# Patient Record
Sex: Female | Born: 1975 | Race: White | Hispanic: No | Marital: Married | Smoking: Never smoker
Health system: Southern US, Community
[De-identification: ages and names within clinical notes are randomized; demographics above are authoritative.]

## PROBLEM LIST (undated history)

## (undated) DIAGNOSIS — G47419 Narcolepsy without cataplexy: Secondary | ICD-10-CM

## (undated) DIAGNOSIS — Z9889 Other specified postprocedural states: Secondary | ICD-10-CM

## (undated) DIAGNOSIS — F32A Depression, unspecified: Secondary | ICD-10-CM

## (undated) DIAGNOSIS — R519 Headache, unspecified: Secondary | ICD-10-CM

## (undated) DIAGNOSIS — F419 Anxiety disorder, unspecified: Secondary | ICD-10-CM

## (undated) DIAGNOSIS — Z973 Presence of spectacles and contact lenses: Secondary | ICD-10-CM

## (undated) DIAGNOSIS — J45909 Unspecified asthma, uncomplicated: Secondary | ICD-10-CM

## (undated) DIAGNOSIS — O24419 Gestational diabetes mellitus in pregnancy, unspecified control: Secondary | ICD-10-CM

---

## 1998-01-23 HISTORY — PX: WISDOM TOOTH EXTRACTION: SHX21

## 2005-01-23 HISTORY — PX: CHOLECYSTECTOMY: SHX55

## 2010-01-23 DIAGNOSIS — M797 Fibromyalgia: Secondary | ICD-10-CM

## 2010-01-23 HISTORY — PX: ESOPHAGOGASTRODUODENOSCOPY: SHX1529

## 2010-01-23 HISTORY — DX: Fibromyalgia: M79.7

## 2011-01-24 HISTORY — PX: ESSURE TUBAL LIGATION: SUR464

## 2016-02-22 DIAGNOSIS — Z01419 Encounter for gynecological examination (general) (routine) without abnormal findings: Secondary | ICD-10-CM | POA: Diagnosis not present

## 2016-03-28 DIAGNOSIS — N611 Abscess of the breast and nipple: Secondary | ICD-10-CM | POA: Diagnosis not present

## 2016-05-19 DIAGNOSIS — G47419 Narcolepsy without cataplexy: Secondary | ICD-10-CM | POA: Diagnosis not present

## 2016-10-27 DIAGNOSIS — Z0000 Encounter for general adult medical examination without abnormal findings: Secondary | ICD-10-CM | POA: Diagnosis not present

## 2016-10-27 DIAGNOSIS — Z79899 Other long term (current) drug therapy: Secondary | ICD-10-CM | POA: Diagnosis not present

## 2016-10-27 DIAGNOSIS — M797 Fibromyalgia: Secondary | ICD-10-CM | POA: Diagnosis not present

## 2016-11-20 DIAGNOSIS — L309 Dermatitis, unspecified: Secondary | ICD-10-CM | POA: Diagnosis not present

## 2016-11-20 DIAGNOSIS — L259 Unspecified contact dermatitis, unspecified cause: Secondary | ICD-10-CM | POA: Diagnosis not present

## 2016-11-20 DIAGNOSIS — J301 Allergic rhinitis due to pollen: Secondary | ICD-10-CM | POA: Diagnosis not present

## 2016-11-20 DIAGNOSIS — J3089 Other allergic rhinitis: Secondary | ICD-10-CM | POA: Diagnosis not present

## 2016-11-22 DIAGNOSIS — J301 Allergic rhinitis due to pollen: Secondary | ICD-10-CM | POA: Diagnosis not present

## 2016-11-22 DIAGNOSIS — J3089 Other allergic rhinitis: Secondary | ICD-10-CM | POA: Diagnosis not present

## 2016-11-22 DIAGNOSIS — L230 Allergic contact dermatitis due to metals: Secondary | ICD-10-CM | POA: Diagnosis not present

## 2017-04-25 DIAGNOSIS — R05 Cough: Secondary | ICD-10-CM | POA: Diagnosis not present

## 2017-04-25 DIAGNOSIS — L230 Allergic contact dermatitis due to metals: Secondary | ICD-10-CM | POA: Diagnosis not present

## 2017-04-25 DIAGNOSIS — J301 Allergic rhinitis due to pollen: Secondary | ICD-10-CM | POA: Diagnosis not present

## 2017-04-25 DIAGNOSIS — J3089 Other allergic rhinitis: Secondary | ICD-10-CM | POA: Diagnosis not present

## 2017-05-04 DIAGNOSIS — Z01419 Encounter for gynecological examination (general) (routine) without abnormal findings: Secondary | ICD-10-CM | POA: Diagnosis not present

## 2017-07-24 DIAGNOSIS — J301 Allergic rhinitis due to pollen: Secondary | ICD-10-CM | POA: Diagnosis not present

## 2017-07-24 DIAGNOSIS — L230 Allergic contact dermatitis due to metals: Secondary | ICD-10-CM | POA: Diagnosis not present

## 2017-07-24 DIAGNOSIS — J3089 Other allergic rhinitis: Secondary | ICD-10-CM | POA: Diagnosis not present

## 2017-07-24 DIAGNOSIS — R05 Cough: Secondary | ICD-10-CM | POA: Diagnosis not present

## 2017-12-10 DIAGNOSIS — Z0000 Encounter for general adult medical examination without abnormal findings: Secondary | ICD-10-CM | POA: Diagnosis not present

## 2017-12-10 DIAGNOSIS — Z23 Encounter for immunization: Secondary | ICD-10-CM | POA: Diagnosis not present

## 2017-12-10 DIAGNOSIS — E7800 Pure hypercholesterolemia, unspecified: Secondary | ICD-10-CM | POA: Diagnosis not present

## 2017-12-10 DIAGNOSIS — N920 Excessive and frequent menstruation with regular cycle: Secondary | ICD-10-CM | POA: Diagnosis not present

## 2017-12-10 DIAGNOSIS — E559 Vitamin D deficiency, unspecified: Secondary | ICD-10-CM | POA: Diagnosis not present

## 2018-03-25 ENCOUNTER — Other Ambulatory Visit: Payer: Self-pay | Admitting: Family Medicine

## 2018-03-25 ENCOUNTER — Ambulatory Visit
Admission: RE | Admit: 2018-03-25 | Discharge: 2018-03-25 | Disposition: A | Discharge status: Home / Self Care | Payer: 59 | Source: Ambulatory Visit | Attending: Family Medicine | Admitting: Family Medicine

## 2018-03-25 DIAGNOSIS — M5412 Radiculopathy, cervical region: Secondary | ICD-10-CM

## 2018-03-25 DIAGNOSIS — M542 Cervicalgia: Secondary | ICD-10-CM | POA: Diagnosis not present

## 2018-04-09 ENCOUNTER — Ambulatory Visit: Payer: 59 | Admitting: Physical Therapy

## 2018-08-28 ENCOUNTER — Other Ambulatory Visit: Payer: Self-pay

## 2018-08-28 DIAGNOSIS — Z20822 Contact with and (suspected) exposure to covid-19: Secondary | ICD-10-CM

## 2018-08-29 LAB — NOVEL CORONAVIRUS, NAA: SARS-CoV-2, NAA: NOT DETECTED

## 2019-01-24 DIAGNOSIS — R7303 Prediabetes: Secondary | ICD-10-CM

## 2019-01-24 HISTORY — DX: Prediabetes: R73.03

## 2019-10-21 ENCOUNTER — Other Ambulatory Visit: Payer: Self-pay | Admitting: Obstetrics and Gynecology

## 2019-10-21 DIAGNOSIS — Z1231 Encounter for screening mammogram for malignant neoplasm of breast: Secondary | ICD-10-CM

## 2019-11-11 ENCOUNTER — Ambulatory Visit: Payer: 59

## 2020-08-10 ENCOUNTER — Other Ambulatory Visit: Payer: Self-pay

## 2020-08-10 ENCOUNTER — Ambulatory Visit
Admission: RE | Admit: 2020-08-10 | Discharge: 2020-08-10 | Disposition: A | Discharge status: Home / Self Care | Payer: 59 | Source: Ambulatory Visit | Attending: Obstetrics and Gynecology | Admitting: Obstetrics and Gynecology

## 2020-08-10 DIAGNOSIS — Z1231 Encounter for screening mammogram for malignant neoplasm of breast: Secondary | ICD-10-CM

## 2021-01-23 HISTORY — PX: ENDOMETRIAL BIOPSY: SHX622

## 2021-01-31 ENCOUNTER — Other Ambulatory Visit: Payer: Self-pay

## 2021-01-31 ENCOUNTER — Encounter (HOSPITAL_BASED_OUTPATIENT_CLINIC_OR_DEPARTMENT_OTHER): Payer: Self-pay | Admitting: Obstetrics and Gynecology

## 2021-01-31 NOTE — Progress Notes (Signed)
PLEASE WEAR A MASK OUT IN PUBLIC AND SOCIAL DISTANCE AND WASH YOUR HANDS FREQUENTLY. PLEASE ASK ALL YOUR CLOSE HOUSEHOLD CONTACT TO WEAR MASK OUT IN PUBLIC AND SOCIAL DISTANCE AND WASH HANDS FREQUENTLY ALSO.      Your procedure is scheduled on Tuesday 02/08/21.  Report to [facility] AT 8:30 A. M.   Call this number if you have problems the morning of surgery  :[phone].   OUR ADDRESS IS [address].  [facility] LOCATED IN [facility].  PLEASE BRING YOUR INSURANCE CARD AND PHOTO ID DAY OF SURGERY.  ONLY ONE PERSON ALLOWED IN FACILITY WAITING AREA.                                     REMEMBER:  DO NOT EAT FOOD, CANDY GUM OR MINTS  AFTER MIDNIGHT THE NIGHT BEFORE YOUR SURGERY . YOU MAY HAVE CLEAR LIQUIDS FROM MIDNIGHT THE NIGHT BEFORE YOUR SURGERY UNTIL  7:30 AM. NO CLEAR LIQUIDS AFTER   7:30 AM DAY OF SURGERY.   YOU MAY  BRUSH YOUR TEETH MORNING OF SURGERY AND RINSE YOUR MOUTH OUT, NO CHEWING GUM CANDY OR MINTS.    CLEAR LIQUID DIET   Foods Allowed                                                                     Foods Excluded  Coffee and tea, regular and decaf                             liquids that you cannot  Plain Jell-O any favor except red or purple                                           see through such as: Fruit ices (not with fruit pulp)                                     milk, soups, orange juice  Iced Popsicles                                    All solid food Carbonated beverages, regular and diet                                    Cranberry, grape and apple juices Sports drinks like Gatorade  Sample Menu Breakfast                                Lunch  Supper Cranberry juice                                           Jell-O                                     Grape juice                           Apple juice Coffee or tea                        Jell-O                                       Popsicle                                                Coffee or tea                        Coffee or tea  _____________________________________________________________________     TAKE THESE MEDICATIONS MORNING OF SURGERY WITH A SIP OF WATER:  Albuterol inhaler if needed (please bring), Singulair, Cymbalta & Flonase  ONE VISITOR IS ALLOWED IN WAITING ROOM ONLY DAY OF SURGERY.  YOU MAY HAVE ANOTHER PERSON SWITCH OUT WITH THE  1  VISITOR IN THE WAITING ROOM DAY OF SURGERY AND A MASK MUST BE WORN IN THE WAITING ROOM.    2 VISITORS  MAY VISIT IN THE EXTENDED RECOVERY ROOM UNTIL 800 PM ONLY 1 VISITOR AGE 50 AND OVER MAY SPEND THE NIGHT AND MUST BE IN EXTENDED RECOVERY ROOM NO LATER THAN 800 PM .    UP TO 2 CHILDREN AGE 41 TO 15 MAY ALSO VISIT IN EXTENDED RECOV ERY ROOM ONLY UNTIL 800 PM AND MUST LEAVE BY 800 PM.   ALL PERSONS VISITING IN EXTENDED RECOVERY ROOM MUST WEAR A MASK.                                    DO NOT WEAR JEWERLY, MAKE UP. DO NOT WEAR LOTIONS, POWDERS, PERFUMES OR NAIL POLISH ON YOUR FINGERNAILS. TOENAIL POLISH IS OK TO WEAR. DO NOT SHAVE FOR 48 HOURS PRIOR TO DAY OF SURGERY. MEN MAY SHAVE FACE AND NECK. CONTACTS, GLASSES, OR DENTURES MAY NOT BE WORN TO SURGERY.                                    [facility] IS NOT RESPONSIBLE  FOR ANY BELONGINGS.                                                                    Marland Kitchen  [facility] - Preparing for Surgery Before surgery, you can play an important role.  Because skin is not sterile, your skin needs to be as free of germs as possible.  You can reduce the number of germs on your skin by washing with CHG (chlorahexidine gluconate) soap before surgery.  CHG is an antiseptic cleaner which kills germs and bonds with the skin to continue killing germs even after washing. Please DO NOT use if you have an allergy to CHG or antibacterial soaps.  If your skin becomes reddened/irritated stop using the CHG and inform your  nurse when you arrive at Short Stay. Do not shave (including legs and underarms) for at least 48 hours prior to the first CHG shower.  You may shave your face/neck. Please follow these instructions carefully:  1.  Shower with CHG Soap the night before surgery and the  morning of Surgery.  2.  If you choose to wash your hair, wash your hair first as usual with your  normal  shampoo.  3.  After you shampoo, rinse your hair and body thoroughly to remove the  shampoo.                            4.  Use CHG as you would any other liquid soap.  You can apply chg directly  to the skin and wash                      Gently with a scrungie or clean washcloth.  5.  Apply the CHG Soap to your body ONLY FROM THE NECK DOWN.   Do not use on face/ open                           Wound or open sores. Avoid contact with eyes, ears mouth and genitals (private parts).                       Wash face,  Genitals (private parts) with your normal soap.             6.  Wash thoroughly, paying special attention to the area where your surgery  will be performed.  7.  Thoroughly rinse your body with warm water from the neck down.  8.  DO NOT shower/wash with your normal soap after using and rinsing off  the CHG Soap.                9.  Pat yourself dry with a clean towel.            10.  Wear clean pajamas.            11.  Place clean sheets on your bed the night of your first shower and do not  sleep with pets. Day of Surgery : Do not apply any lotions/deodorants the morning of surgery.  Please wear clean clothes to the hospital/surgery center.  IF YOU HAVE ANY SKIN IRRITATION OR PROBLEMS WITH THE SURGICAL SOAP, PLEASE GET A BAR OF GOLD DIAL SOAP AND SHOWER THE NIGHT BEFORE YOUR SURGERY AND THE MORNING OF YOUR SURGERY. PLEASE LET THE NURSE KNOW MORNING OF YOUR SURGERY IF YOU HAD ANY PROBLEMS WITH THE SURGICAL SOAP.  FAILURE TO FOLLOW THESE INSTRUCTIONS MAY RESULT IN THE CANCELLATION OF YOUR SURGERY PATIENT  SIGNATURE_________________________________  NURSE SIGNATURE____Kelly Odis Luster, RN_____________________________  ________________________________________________________________________  QUESTIONS CALL Malaiah Viramontes PRE OP NURSE PHONE [phone].

## 2021-01-31 NOTE — Progress Notes (Addendum)
Spoke w/ via phone for pre-op interview---pt Lab needs dos----urine pregnancy POCT , requested orders from [facility] via Epic IB on 01/31/21             Lab results------Pt has a lab appt on 02/04/21 for CBC and type & screen. COVID test -----patient states asymptomatic no test needed Arrive at -------0830 on 02/08/21 NPO after MN NO Solid Food.  Clear liquids from MN until---0730 Med rec completed Medications to take morning of surgery -----Albuterol prn, Flonase, Singulair & Cymbalta Diabetic medication -----n/a Patient instructed no nail polish to be worn day of surgery Patient instructed to bring photo id and insurance card day of surgery Patient aware to have Driver (ride ) / caregiver    for 24 hours after surgery - husband [facility] Patient Special Instructions -----Bring inhaler. Extended stay instructions given. Pre-Op special Istructions -----none Patient verbalized understanding of instructions that were given at this phone interview. Patient denies shortness of breath, chest pain, fever, cough at this phone interview.

## 2021-02-04 ENCOUNTER — Encounter (HOSPITAL_COMMUNITY)
Admission: RE | Admit: 2021-02-04 | Discharge: 2021-02-04 | Disposition: A | Discharge status: Home / Self Care | Payer: 59 | Source: Ambulatory Visit | Attending: Obstetrics and Gynecology | Admitting: Obstetrics and Gynecology

## 2021-02-04 ENCOUNTER — Other Ambulatory Visit: Payer: Self-pay

## 2021-02-04 DIAGNOSIS — Z01812 Encounter for preprocedural laboratory examination: Secondary | ICD-10-CM | POA: Insufficient documentation

## 2021-02-04 DIAGNOSIS — Z01818 Encounter for other preprocedural examination: Secondary | ICD-10-CM

## 2021-02-04 LAB — CBC
HCT: 41.1 % (ref 36.0–46.0)
Hemoglobin: 13.4 g/dL (ref 12.0–15.0)
MCH: 29.7 pg (ref 26.0–34.0)
MCHC: 32.6 g/dL (ref 30.0–36.0)
MCV: 91.1 fL (ref 80.0–100.0)
Platelets: 202 10*3/uL (ref 150–400)
RBC: 4.51 MIL/uL (ref 3.87–5.11)
RDW: 12.9 % (ref 11.5–15.5)
WBC: 7.5 10*3/uL (ref 4.0–10.5)
nRBC: 0 % (ref 0.0–0.2)

## 2021-02-08 DIAGNOSIS — Z01818 Encounter for other preprocedural examination: Secondary | ICD-10-CM

## 2021-02-08 LAB — TYPE AND SCREEN
ABO/RH(D): O POS
Antibody Screen: NEGATIVE

## 2021-03-30 ENCOUNTER — Ambulatory Visit (HOSPITAL_BASED_OUTPATIENT_CLINIC_OR_DEPARTMENT_OTHER): Admission: RE | Admit: 2021-03-30 | Payer: 59 | Source: Home / Self Care | Admitting: Obstetrics and Gynecology

## 2021-03-30 DIAGNOSIS — Z01818 Encounter for other preprocedural examination: Secondary | ICD-10-CM

## 2021-03-30 HISTORY — DX: Unspecified asthma, uncomplicated: J45.909

## 2021-03-30 HISTORY — DX: Depression, unspecified: F32.A

## 2021-03-30 HISTORY — DX: Gestational diabetes mellitus in pregnancy, unspecified control: O24.419

## 2021-03-30 HISTORY — DX: Presence of spectacles and contact lenses: Z97.3

## 2021-03-30 HISTORY — DX: Anxiety disorder, unspecified: F41.9

## 2021-03-30 HISTORY — DX: Narcolepsy without cataplexy: G47.419

## 2021-03-30 HISTORY — DX: Headache, unspecified: R51.9

## 2021-03-30 SURGERY — XI ROBOTIC ASSISTED LAPAROSCOPIC HYSTERECTOMY AND SALPINGECTOMY
Anesthesia: Choice

## 2021-06-01 DIAGNOSIS — Z01818 Encounter for other preprocedural examination: Secondary | ICD-10-CM

## 2021-06-08 ENCOUNTER — Ambulatory Visit (HOSPITAL_BASED_OUTPATIENT_CLINIC_OR_DEPARTMENT_OTHER): Admit: 2021-06-08 | Payer: 59 | Admitting: Obstetrics and Gynecology

## 2021-06-08 ENCOUNTER — Encounter (HOSPITAL_BASED_OUTPATIENT_CLINIC_OR_DEPARTMENT_OTHER): Payer: Self-pay

## 2021-06-08 SURGERY — DILATATION & CURETTAGE/HYSTEROSCOPY WITH HYDROTHERMAL ABLATION
Anesthesia: Choice

## 2022-02-27 ENCOUNTER — Encounter (HOSPITAL_BASED_OUTPATIENT_CLINIC_OR_DEPARTMENT_OTHER): Payer: Self-pay | Admitting: Obstetrics and Gynecology

## 2022-02-27 ENCOUNTER — Other Ambulatory Visit: Payer: Self-pay

## 2022-02-27 NOTE — Progress Notes (Signed)
Your procedure is scheduled on Wednesday, 03/08/2022.  Report to [facility] AT  6:30 AM.   Call this number if you have problems the morning of surgery  :[phone].   OUR ADDRESS IS [place].  [facility]ATED [facility].  PLEASE BRING YOUR INSURANCE CARD AND PHOTO ID DAY OF SURGERY.  ONLY 2 PEOPLE ARE ALLOWED IN  WAITING  ROOM / CURRENTLY NO ONE UNDER AGE 45                                     REMEMBER:  DO NOT EAT FOOD, CANDY GUM OR MINTS  AFTER MIDNIGHT THE NIGHT BEFORE YOUR SURGERY . YOU MAY HAVE CLEAR LIQUIDS FROM MIDNIGHT THE NIGHT BEFORE YOUR SURGERY UNTIL  5:30 AM. NO CLEAR LIQUIDS AFTER   5:30 AM DAY OF SURGERY.  YOU MAY  BRUSH YOUR TEETH MORNING OF SURGERY AND RINSE YOUR MOUTH OUT, NO CHEWING GUM CANDY OR MINTS.     CLEAR LIQUID DIET    Allowed      Water                                                                   Coffee and tea, regular and decaf  (NO cream or milk products of any type, may sweeten)                         Carbonated beverages, regular and diet                                    Sports drinks like Gatorade _____________________________________________________________________     TAKE ONLY THESE MEDICATIONS MORNING OF SURGERY: Albuterol inhaler (please bring inhaler with you on the day of surgery), Cymbalta, Flonase, Singulair, Zyrtec    UP TO 4 VISITORS  MAY VISIT IN THE EXTENDED RECOVERY ROOM UNTIL 800 PM ONLY.  ONE  VISITOR AGE 25 AND OVER MAY SPEND THE NIGHT AND MUST BE IN EXTENDED RECOVERY ROOM NO LATER THAN 800 PM . YOUR DISCHARGE TIME AFTER YOU SPEND THE NIGHT IS 900 AM THE MORNING AFTER YOUR SURGERY.  YOU MAY PACK A SMALL OVERNIGHT BAG WITH TOILETRIES FOR YOUR OVERNIGHT STAY IF YOU WISH.  YOUR PRESCRIPTION MEDICATIONS WILL BE PROVIDED DURING [place].                                      DO NOT WEAR JEWERLY/  METAL/  PIERCINGS (INCLUDING NO PLASTIC PIERCINGS) DO NOT  WEAR LOTIONS, POWDERS, PERFUMES OR NAIL POLISH ON YOUR FINGERNAILS. TOENAIL POLISH IS OK TO WEAR. DO NOT SHAVE FOR 48 HOURS PRIOR TO DAY OF SURGERY.  CONTACTS, GLASSES, OR DENTURES MAY NOT BE WORN TO SURGERY.  REMEMBER: NO SMOKING, VAPING ,  DRUGS OR ALCOHOL FOR 24 HOURS BEFORE YOUR SURGERY.  [facility] IS NOT RESPONSIBLE  FOR ANY BELONGINGS.                                                                    Marland Kitchen           [facility] - Preparing for Surgery Before surgery, you can play an important role.  Because skin is not sterile, your skin needs to be as free of germs as possible.  You can reduce the number of germs on your skin by washing with CHG (chlorahexidine gluconate) soap before surgery.  CHG is an antiseptic cleaner which kills germs and bonds with the skin to continue killing germs even after washing. Please DO NOT use if you have an allergy to CHG or antibacterial soaps.  If your skin becomes reddened/irritated stop using the CHG and inform your nurse when you arrive at Short Stay. Do not shave (including legs and underarms) for at least 48 hours prior to the first CHG shower.  You may shave your face/neck. Please follow these instructions carefully:  1.  Shower with CHG Soap the night before surgery and the  morning of Surgery.  2.  If you choose to wash your hair, wash your hair first as usual with your  normal  shampoo.  3.  After you shampoo, rinse your hair and body thoroughly to remove the  shampoo.                                        4.  Use CHG as you would any other liquid soap.  You can apply chg directly  to the skin and wash , chg soap provided, night before and morning of your surgery.  5.  Apply the CHG Soap to your body ONLY FROM THE NECK DOWN.   Do not use on face/ open                           Wound or open sores. Avoid contact with eyes, ears mouth and genitals (private parts).                       Wash face,  Genitals  (private parts) with your normal soap.             6.  Wash thoroughly, paying special attention to the area where your surgery  will be performed.  7.  Thoroughly rinse your body with warm water from the neck down.  8.  DO NOT shower/wash with your normal soap after using and rinsing off  the CHG Soap.             9.  Pat yourself dry with a clean towel.            10.  Wear clean pajamas.            11.  Place clean sheets on your bed the night of your first shower and do not  sleep with pets. Day of Surgery : Do not apply any lotions/ powders the morning of surgery.  Please wear clean clothes to the hospital/surgery  center.  IF YOU HAVE ANY SKIN IRRITATION OR PROBLEMS WITH THE SURGICAL SOAP, PLEASE GET A BAR OF GOLD DIAL SOAP AND SHOWER THE NIGHT BEFORE YOUR SURGERY AND THE MORNING OF YOUR SURGERY. PLEASE LET THE NURSE KNOW MORNING OF YOUR SURGERY IF YOU HAD ANY PROBLEMS WITH THE SURGICAL SOAP.   ________________________________________________________________________                                                        QUESTIONS Holland Falling PRE OP NURSE PHONE [phone].

## 2022-02-27 NOTE — Progress Notes (Addendum)
Spoke w/ via phone for pre-op interview---Melody May needs dos---- urine pregnancy per anesthesia, surgeon orders pending as of 02/27/22              May results------03/03/22 May appt for cbc, type & screen COVID test -----patient states asymptomatic no test needed Arrive at -------0630 on Wednesday, 03/08/22 NPO after MN NO Solid Food.  Clear liquids from MN until---0530 Med rec completed Medications to take morning of surgery ----- Albuterol inhaler, Cymbalta, Flonase, Singulair, Zyrtec Diabetic medication - none Patient instructed no nail polish to be worn day of surgery Patient instructed to bring photo id and insurance card day of surgery Patient aware to have Driver (ride ) / caregiver    for 24 hours after surgery - husband - Melody May Patient Special Instructions -----Bring inhaler on day of surgery. Extended / overnight stay instructions given. Pre-Op special Istructions -----Requested orders from Dr. Landry Mellow via [place] on 02/22/22. Patient verbalized understanding of instructions that were given at this phone interview. Patient denies shortness of breath, chest pain, fever, cough at this phone interview.

## 2022-03-03 ENCOUNTER — Encounter (HOSPITAL_COMMUNITY)
Admission: RE | Admit: 2022-03-03 | Discharge: 2022-03-03 | Disposition: A | Discharge status: Home / Self Care | Payer: 59 | Source: Ambulatory Visit | Attending: Obstetrics and Gynecology | Admitting: Obstetrics and Gynecology

## 2022-03-03 DIAGNOSIS — Z01818 Encounter for other preprocedural examination: Secondary | ICD-10-CM

## 2022-03-03 DIAGNOSIS — Z01812 Encounter for preprocedural laboratory examination: Secondary | ICD-10-CM | POA: Diagnosis not present

## 2022-03-03 LAB — CBC
HCT: 41 % (ref 36.0–46.0)
Hemoglobin: 13.1 g/dL (ref 12.0–15.0)
MCH: 29.1 pg (ref 26.0–34.0)
MCHC: 32 g/dL (ref 30.0–36.0)
MCV: 91.1 fL (ref 80.0–100.0)
Platelets: 256 K/uL (ref 150–400)
RBC: 4.5 MIL/uL (ref 3.87–5.11)
RDW: 12.8 % (ref 11.5–15.5)
WBC: 7.7 K/uL (ref 4.0–10.5)
nRBC: 0 % (ref 0.0–0.2)

## 2022-03-07 ENCOUNTER — Other Ambulatory Visit: Payer: Self-pay | Admitting: Obstetrics and Gynecology

## 2022-03-07 DIAGNOSIS — N920 Excessive and frequent menstruation with regular cycle: Secondary | ICD-10-CM

## 2022-03-07 NOTE — H&P (Signed)
  The note originally documented on this encounter has been moved the the encounter in which it belongs.  

## 2022-03-07 NOTE — H&P (Signed)
  The note originally documented on this encounter has been moved the the encounter in which it belongs.    Heat or cold intolerance no.     HEMATOLOGY/LYMPH:         Abnormal bleeding no.  Easy bruising no.   Swollen glands no.     DERMATOLOGY:         New/changing skin lesion no.  Rash no.  Sores no.     Vital Signs Wt: 190.4, Wt change: 1 lbs, Ht: 65.5, BMI: 31.2, Pulse sitting: 103, BP sitting: 146/93. Examination General Examination:        CONSTITUTIONAL: alert, oriented, NAD. SKIN:  moist, warm. EYES:  Conjunctiva clear. LUNGS: good I:E efffort noted, clear to auscultation bilaterally. HEART: regular rate and rhythm. ABDOMEN: soft, non-tender/non-distended, bowel sounds present. FEMALE GENITOURINARY: normal external genitalia, labia - unremarkable, vagina - pink moist mucosa, no lesions or abnormal discharge, cervix - no discharge or lesions or CMT, adnexa - no masses or tenderness, uterus - nontender and normal size on palpation. EXTREMITIES: no edema present. PSYCH:  affect normal, good eye contact.  Physical Examination Chaperone present:         Chaperone present  Durward Fortes, [place] 02/22/2022 12:09:41 PM >, for pelvic exam.      Assessments 1. Menorrhagia - N92.0 (Primary) Treatment 1. Menorrhagia  Notes: Pt is scheduled for robotic assisted laparoscopic hysterectomy w/ bilateral salpingectomy for management of menorrhagia and fibroids. Pt advised she can stay overnight, however can leave same day if she would like. She is advised that she will have to stay 2 nights in the hospital if conversion to larger incision. She is advised that in order to be discharged from hospital, she will need to be able to ambulate, urinate, tolerate food and pain must be controlled wit oral medication. . Discussed w/ pt risks of hysterectomy including but not limited to infection/bleeding, conversion to larger/abdominal incision, damage to bowel, bladder, ureters and surrounding organs, with the need for further surgery. Discussed risk of blood transfusion and risk of HIV or hep B&C with blood transfusion. Pt is aware of risks and desires blood transfusion if needed. Pt advised to avoid NSAIDs (Asprin, Aleve, Advil,  Ibuprofen, Motrin, Excedrin) between now and surgery given risk of bleeding during surgery. She may take Tylenol for pain management. She is advised to avoid eating or drinking starting midnight prior to surgery. Discussed post-surgery avoidance of driving for 1 week and lifting weight greater than 10 lbs or intercourse for 6-8 weeks.

## 2022-03-07 NOTE — Anesthesia Preprocedure Evaluation (Signed)
Anesthesia Evaluation  Patient identified by MRN, date of birth, ID band Patient awake    Reviewed: Allergy & Precautions, NPO status , Patient's Chart, lab work & pertinent test results  History of Anesthesia Complications (+) PONV and history of anesthetic complications  Airway Mallampati: II  TM Distance: >3 FB Neck ROM: Full    Dental no notable dental hx.    Pulmonary asthma    Pulmonary exam normal        Cardiovascular negative cardio ROS Normal cardiovascular exam     Neuro/Psych  Headaches  Anxiety Depression    narcolepsy    GI/Hepatic negative GI ROS, Neg liver ROS,,,  Endo/Other  Pre-DM  Renal/GU negative Renal ROS  negative genitourinary   Musculoskeletal  (+)  Fibromyalgia -  Abdominal   Peds  Hematology negative hematology ROS (+)   Anesthesia Other Findings Day of surgery medications reviewed with patient.  Reproductive/Obstetrics Menorrhagia                              Anesthesia Physical Anesthesia Plan  ASA: 2  Anesthesia Plan: General   Post-op Pain Management: Tylenol PO (pre-op)* and Toradol IV (intra-op)*   Induction: Intravenous  PONV Risk Score and Plan: 4 or greater and Treatment may vary due to age or medical condition, Ondansetron, Dexamethasone, Midazolam and Scopolamine patch - Pre-op  Airway Management Planned: Oral ETT  Additional Equipment: None  Intra-op Plan:   Post-operative Plan: Extubation in OR  Informed Consent: I have reviewed the patients History and Physical, chart, labs and discussed the procedure including the risks, benefits and alternatives for the proposed anesthesia with the patient or authorized representative who has indicated his/her understanding and acceptance.     Dental advisory given  Plan Discussed with: CRNA  Anesthesia Plan Comments:         Anesthesia Quick Evaluation

## 2022-03-08 ENCOUNTER — Other Ambulatory Visit: Payer: Self-pay

## 2022-03-08 ENCOUNTER — Observation Stay (HOSPITAL_BASED_OUTPATIENT_CLINIC_OR_DEPARTMENT_OTHER): Payer: 59 | Admitting: Anesthesiology

## 2022-03-08 ENCOUNTER — Encounter (HOSPITAL_BASED_OUTPATIENT_CLINIC_OR_DEPARTMENT_OTHER)
Admission: RE | Disposition: A | Discharge status: Home / Self Care | Payer: Self-pay | Source: Home / Self Care | Attending: Obstetrics and Gynecology

## 2022-03-08 ENCOUNTER — Observation Stay (HOSPITAL_BASED_OUTPATIENT_CLINIC_OR_DEPARTMENT_OTHER)
Admission: RE | Admit: 2022-03-08 | Discharge: 2022-03-08 | Disposition: A | Discharge status: Home / Self Care | Payer: 59 | Attending: Obstetrics and Gynecology | Admitting: Obstetrics and Gynecology

## 2022-03-08 ENCOUNTER — Encounter (HOSPITAL_BASED_OUTPATIENT_CLINIC_OR_DEPARTMENT_OTHER): Payer: Self-pay | Admitting: Obstetrics and Gynecology

## 2022-03-08 DIAGNOSIS — N920 Excessive and frequent menstruation with regular cycle: Secondary | ICD-10-CM | POA: Diagnosis not present

## 2022-03-08 DIAGNOSIS — Z01818 Encounter for other preprocedural examination: Secondary | ICD-10-CM

## 2022-03-08 DIAGNOSIS — J45909 Unspecified asthma, uncomplicated: Secondary | ICD-10-CM | POA: Insufficient documentation

## 2022-03-08 DIAGNOSIS — N921 Excessive and frequent menstruation with irregular cycle: Secondary | ICD-10-CM | POA: Diagnosis present

## 2022-03-08 DIAGNOSIS — F17210 Nicotine dependence, cigarettes, uncomplicated: Secondary | ICD-10-CM | POA: Diagnosis not present

## 2022-03-08 DIAGNOSIS — Z79899 Other long term (current) drug therapy: Secondary | ICD-10-CM | POA: Insufficient documentation

## 2022-03-08 DIAGNOSIS — Z90710 Acquired absence of both cervix and uterus: Secondary | ICD-10-CM | POA: Diagnosis present

## 2022-03-08 HISTORY — PX: ROBOTIC ASSISTED LAPAROSCOPIC HYSTERECTOMY AND SALPINGECTOMY: SHX6379

## 2022-03-08 HISTORY — DX: Other specified postprocedural states: Z98.890

## 2022-03-08 LAB — TYPE AND SCREEN
ABO/RH(D): O POS
Antibody Screen: NEGATIVE

## 2022-03-08 LAB — POCT PREGNANCY, URINE: Preg Test, Ur: NEGATIVE

## 2022-03-08 SURGERY — XI ROBOTIC ASSISTED LAPAROSCOPIC HYSTERECTOMY AND SALPINGECTOMY
Anesthesia: General | Site: Pelvis | Laterality: Bilateral

## 2022-03-08 MED ORDER — KETOROLAC TROMETHAMINE 30 MG/ML IJ SOLN
INTRAMUSCULAR | Status: AC
  Filled 2022-03-08: qty 1

## 2022-03-08 MED ORDER — FENTANYL CITRATE (PF) 100 MCG/2ML IJ SOLN
25.0000 ug | INTRAMUSCULAR | Status: DC | PRN
  Administered 2022-03-08: 50 ug via INTRAVENOUS

## 2022-03-08 MED ORDER — ALBUTEROL SULFATE HFA 108 (90 BASE) MCG/ACT IN AERS: 2.0000 | INHALATION_SPRAY | Freq: Four times a day (QID) | RESPIRATORY_TRACT | Status: DC | PRN

## 2022-03-08 MED ORDER — OXYCODONE HCL 5 MG PO TABS: 5.0000 mg | ORAL_TABLET | Freq: Once | ORAL | Status: DC | PRN

## 2022-03-08 MED ORDER — PROPOFOL 10 MG/ML IV BOLUS
INTRAVENOUS | Status: DC | PRN
  Administered 2022-03-08: 40 mg via INTRAVENOUS
  Administered 2022-03-08: 160 mg via INTRAVENOUS

## 2022-03-08 MED ORDER — OXYCODONE HCL 5 MG PO TABS
ORAL_TABLET | ORAL | Status: AC
  Filled 2022-03-08: qty 1

## 2022-03-08 MED ORDER — DEXAMETHASONE SODIUM PHOSPHATE 10 MG/ML IJ SOLN
INTRAMUSCULAR | Status: DC | PRN
  Administered 2022-03-08: 10 mg via INTRAVENOUS

## 2022-03-08 MED ORDER — SIMETHICONE 80 MG PO CHEW: 80.0000 mg | CHEWABLE_TABLET | Freq: Four times a day (QID) | ORAL | Status: DC | PRN

## 2022-03-08 MED ORDER — OXYCODONE HCL 5 MG PO TABS
5.0000 mg | ORAL_TABLET | ORAL | Status: DC | PRN
  Administered 2022-03-08 (×2): 5 mg via ORAL

## 2022-03-08 MED ORDER — SCOPOLAMINE 1 MG/3DAYS TD PT72
1.0000 | MEDICATED_PATCH | Freq: Once | TRANSDERMAL | Status: DC
  Administered 2022-03-08: 1.5 mg via TRANSDERMAL

## 2022-03-08 MED ORDER — KETOROLAC TROMETHAMINE 30 MG/ML IJ SOLN
INTRAMUSCULAR | Status: DC | PRN
  Administered 2022-03-08: 30 mg via INTRAVENOUS

## 2022-03-08 MED ORDER — ONDANSETRON HCL 4 MG/2ML IJ SOLN
INTRAMUSCULAR | Status: AC
  Filled 2022-03-08: qty 2

## 2022-03-08 MED ORDER — STERILE WATER FOR IRRIGATION IR SOLN
Status: DC | PRN
  Administered 2022-03-08: 500 mL

## 2022-03-08 MED ORDER — ONDANSETRON HCL 4 MG/2ML IJ SOLN
INTRAMUSCULAR | Status: DC | PRN
  Administered 2022-03-08: 4 mg via INTRAVENOUS

## 2022-03-08 MED ORDER — GABAPENTIN 300 MG PO CAPS
ORAL_CAPSULE | ORAL | Status: AC
  Filled 2022-03-08: qty 1

## 2022-03-08 MED ORDER — LACTATED RINGERS IV SOLN
INTRAVENOUS | Status: DC
  Administered 2022-03-08: 1000 mL via INTRAVENOUS

## 2022-03-08 MED ORDER — GABAPENTIN 300 MG PO CAPS
300.0000 mg | ORAL_CAPSULE | ORAL | Status: AC
  Administered 2022-03-08: 300 mg via ORAL

## 2022-03-08 MED ORDER — FENTANYL CITRATE (PF) 250 MCG/5ML IJ SOLN
INTRAMUSCULAR | Status: AC
  Filled 2022-03-08: qty 5

## 2022-03-08 MED ORDER — LACTATED RINGERS IV SOLN: INTRAVENOUS | Status: DC

## 2022-03-08 MED ORDER — HEMOSTATIC AGENTS (NO CHARGE) OPTIME
TOPICAL | Status: DC | PRN
  Administered 2022-03-08: 1

## 2022-03-08 MED ORDER — LIDOCAINE HCL (PF) 2 % IJ SOLN
INTRAMUSCULAR | Status: AC
  Filled 2022-03-08: qty 5

## 2022-03-08 MED ORDER — ONDANSETRON HCL 4 MG/2ML IJ SOLN: 4.0000 mg | Freq: Four times a day (QID) | INTRAMUSCULAR | Status: DC | PRN

## 2022-03-08 MED ORDER — ROCURONIUM BROMIDE 10 MG/ML (PF) SYRINGE
PREFILLED_SYRINGE | INTRAVENOUS | Status: AC
  Filled 2022-03-08: qty 10

## 2022-03-08 MED ORDER — GABAPENTIN 100 MG PO CAPS: 100.0000 mg | ORAL_CAPSULE | Freq: Two times a day (BID) | ORAL | Status: DC

## 2022-03-08 MED ORDER — EPHEDRINE SULFATE-NACL 50-0.9 MG/10ML-% IV SOSY
PREFILLED_SYRINGE | INTRAVENOUS | Status: DC | PRN
  Administered 2022-03-08: 5 mg via INTRAVENOUS

## 2022-03-08 MED ORDER — ACETAMINOPHEN 500 MG PO TABS
1000.0000 mg | ORAL_TABLET | ORAL | Status: AC
  Administered 2022-03-08: 1000 mg via ORAL

## 2022-03-08 MED ORDER — ROCURONIUM BROMIDE 10 MG/ML (PF) SYRINGE
PREFILLED_SYRINGE | INTRAVENOUS | Status: DC | PRN
  Administered 2022-03-08: 70 mg via INTRAVENOUS
  Administered 2022-03-08: 30 mg via INTRAVENOUS

## 2022-03-08 MED ORDER — DEXAMETHASONE SODIUM PHOSPHATE 10 MG/ML IJ SOLN
INTRAMUSCULAR | Status: AC
  Filled 2022-03-08: qty 1

## 2022-03-08 MED ORDER — SENNA 8.6 MG PO TABS
1.0000 | ORAL_TABLET | Freq: Two times a day (BID) | ORAL | Status: DC
  Administered 2022-03-08: 8.6 mg via ORAL

## 2022-03-08 MED ORDER — AMISULPRIDE (ANTIEMETIC) 5 MG/2ML IV SOLN: 10.0000 mg | Freq: Once | INTRAVENOUS | Status: DC | PRN

## 2022-03-08 MED ORDER — ACETAMINOPHEN 500 MG PO TABS
ORAL_TABLET | ORAL | Status: AC
  Filled 2022-03-08: qty 2

## 2022-03-08 MED ORDER — OXYCODONE HCL 5 MG/5ML PO SOLN: 5.0000 mg | Freq: Once | ORAL | Status: DC | PRN

## 2022-03-08 MED ORDER — ACETAMINOPHEN 500 MG PO TABS: 1000.0000 mg | ORAL_TABLET | Freq: Once | ORAL | Status: DC

## 2022-03-08 MED ORDER — ZOLPIDEM TARTRATE 5 MG PO TABS: 5.0000 mg | ORAL_TABLET | Freq: Every evening | ORAL | Status: DC | PRN

## 2022-03-08 MED ORDER — PROPOFOL 500 MG/50ML IV EMUL
INTRAVENOUS | Status: AC
  Filled 2022-03-08: qty 50

## 2022-03-08 MED ORDER — SUGAMMADEX SODIUM 200 MG/2ML IV SOLN
INTRAVENOUS | Status: DC | PRN
  Administered 2022-03-08: 200 mg via INTRAVENOUS

## 2022-03-08 MED ORDER — MENTHOL 3 MG MT LOZG: 1.0000 | LOZENGE | OROMUCOSAL | Status: DC | PRN

## 2022-03-08 MED ORDER — FENTANYL CITRATE (PF) 100 MCG/2ML IJ SOLN
INTRAMUSCULAR | Status: AC
  Filled 2022-03-08: qty 2

## 2022-03-08 MED ORDER — HYDROMORPHONE HCL 1 MG/ML IJ SOLN: 0.2000 mg | INTRAMUSCULAR | Status: DC | PRN

## 2022-03-08 MED ORDER — POVIDONE-IODINE 10 % EX SWAB: 2.0000 | Freq: Once | CUTANEOUS | Status: DC

## 2022-03-08 MED ORDER — OXYCODONE HCL 5 MG PO TABS: 5.0000 mg | ORAL_TABLET | Freq: Four times a day (QID) | ORAL | 0 refills | Status: AC | PRN

## 2022-03-08 MED ORDER — ONDANSETRON HCL 4 MG PO TABS: 4.0000 mg | ORAL_TABLET | Freq: Four times a day (QID) | ORAL | Status: DC | PRN

## 2022-03-08 MED ORDER — ALUM & MAG HYDROXIDE-SIMETH 200-200-20 MG/5ML PO SUSP: 30.0000 mL | ORAL | Status: DC | PRN

## 2022-03-08 MED ORDER — IBUPROFEN 600 MG PO TABS: 600.0000 mg | ORAL_TABLET | Freq: Four times a day (QID) | ORAL | 1 refills | Status: AC | PRN

## 2022-03-08 MED ORDER — BUSPIRONE HCL 5 MG PO TABS: 5.0000 mg | ORAL_TABLET | Freq: Every day | ORAL | Status: DC

## 2022-03-08 MED ORDER — PANTOPRAZOLE SODIUM 40 MG PO TBEC
DELAYED_RELEASE_TABLET | ORAL | Status: AC
  Filled 2022-03-08: qty 1

## 2022-03-08 MED ORDER — MIDAZOLAM HCL 2 MG/2ML IJ SOLN
INTRAMUSCULAR | Status: AC
  Filled 2022-03-08: qty 2

## 2022-03-08 MED ORDER — LIDOCAINE 2% (20 MG/ML) 5 ML SYRINGE
INTRAMUSCULAR | Status: DC | PRN
  Administered 2022-03-08: 80 mg via INTRAVENOUS

## 2022-03-08 MED ORDER — PANTOPRAZOLE SODIUM 40 MG PO TBEC
40.0000 mg | DELAYED_RELEASE_TABLET | Freq: Every day | ORAL | Status: DC
  Administered 2022-03-08: 40 mg via ORAL

## 2022-03-08 MED ORDER — SODIUM CHLORIDE 0.9 % IV SOLN
2.0000 g | INTRAVENOUS | Status: AC
  Administered 2022-03-08: 2 g via INTRAVENOUS

## 2022-03-08 MED ORDER — FENTANYL CITRATE (PF) 250 MCG/5ML IJ SOLN
INTRAMUSCULAR | Status: DC | PRN
  Administered 2022-03-08: 50 ug via INTRAVENOUS
  Administered 2022-03-08: 100 ug via INTRAVENOUS
  Administered 2022-03-08 (×2): 50 ug via INTRAVENOUS

## 2022-03-08 MED ORDER — IBUPROFEN 200 MG PO TABS
600.0000 mg | ORAL_TABLET | Freq: Four times a day (QID) | ORAL | Status: DC
  Administered 2022-03-08: 600 mg via ORAL

## 2022-03-08 MED ORDER — SODIUM CHLORIDE 0.9 % IV SOLN
INTRAVENOUS | Status: DC | PRN
  Administered 2022-03-08: 105 mL

## 2022-03-08 MED ORDER — SCOPOLAMINE 1 MG/3DAYS TD PT72
MEDICATED_PATCH | TRANSDERMAL | Status: AC
  Filled 2022-03-08: qty 1

## 2022-03-08 MED ORDER — ACETAMINOPHEN 500 MG PO TABS
1000.0000 mg | ORAL_TABLET | Freq: Four times a day (QID) | ORAL | Status: DC
  Administered 2022-03-08: 1000 mg via ORAL

## 2022-03-08 MED ORDER — IBUPROFEN 200 MG PO TABS
ORAL_TABLET | ORAL | Status: AC
  Filled 2022-03-08: qty 3

## 2022-03-08 MED ORDER — ACETAMINOPHEN 500 MG PO TABS: 1000.0000 mg | ORAL_TABLET | Freq: Three times a day (TID) | ORAL | 0 refills | Status: AC | PRN

## 2022-03-08 MED ORDER — SODIUM CHLORIDE 0.9 % IR SOLN
Status: DC | PRN
  Administered 2022-03-08: 1000 mL

## 2022-03-08 MED ORDER — SODIUM CHLORIDE 0.9 % IV SOLN
INTRAVENOUS | Status: AC
  Filled 2022-03-08: qty 2

## 2022-03-08 MED ORDER — SENNA 8.6 MG PO TABS
ORAL_TABLET | ORAL | Status: AC
  Filled 2022-03-08: qty 1

## 2022-03-08 SURGICAL SUPPLY — 72 items
ADH SKN CLS APL DERMABOND .7 (GAUZE/BANDAGES/DRESSINGS) ×1
APL SRG 38 LTWT LNG FL B (MISCELLANEOUS) ×1
APPLICATOR ARISTA FLEXITIP XL (MISCELLANEOUS) IMPLANT
BARRIER ADHS 3X4 INTERCEED (GAUZE/BANDAGES/DRESSINGS) IMPLANT
BRR ADH 4X3 ABS CNTRL BYND (GAUZE/BANDAGES/DRESSINGS)
CANNULA CAP OBTURATR AIRSEAL 8 (CAP) IMPLANT
CATH FOLEY 3WAY  5CC 16FR (CATHETERS) ×1
CATH FOLEY 3WAY 5CC 16FR (CATHETERS) ×1 IMPLANT
CELLS DAT CNTRL 66122 CELL SVR (MISCELLANEOUS) IMPLANT
COVER BACK TABLE 60X90IN (DRAPES) ×1 IMPLANT
COVER TIP SHEARS 8 DVNC (MISCELLANEOUS) ×1 IMPLANT
COVER TIP SHEARS 8MM DA VINCI (MISCELLANEOUS) ×1
DEFOGGER SCOPE WARMER CLEARIFY (MISCELLANEOUS) ×1 IMPLANT
DERMABOND ADVANCED .7 DNX12 (GAUZE/BANDAGES/DRESSINGS) ×1 IMPLANT
DILATOR CANAL MILEX (MISCELLANEOUS) ×1 IMPLANT
DRAPE ARM DVNC X/XI (DISPOSABLE) ×4 IMPLANT
DRAPE COLUMN DVNC XI (DISPOSABLE) ×1 IMPLANT
DRAPE DA VINCI XI ARM (DISPOSABLE) ×4
DRAPE DA VINCI XI COLUMN (DISPOSABLE) ×1
DRAPE SURG IRRIG POUCH 19X23 (DRAPES) ×1 IMPLANT
DRAPE UTILITY 15X26 TOWEL STRL (DRAPES) ×1 IMPLANT
DURAPREP 26ML APPLICATOR (WOUND CARE) ×1 IMPLANT
ELECT REM PT RETURN 9FT ADLT (ELECTROSURGICAL) ×1
ELECTRODE REM PT RTRN 9FT ADLT (ELECTROSURGICAL) ×1 IMPLANT
GAUZE 4X4 16PLY ~~LOC~~+RFID DBL (SPONGE) IMPLANT
GLOVE BIOGEL M 6.5 STRL (GLOVE) ×3 IMPLANT
GLOVE BIOGEL PI IND STRL 6.5 (GLOVE) ×3 IMPLANT
HEMOSTAT ARISTA ABSORB 3G PWDR (HEMOSTASIS) IMPLANT
HOLDER FOLEY CATH W/STRAP (MISCELLANEOUS) IMPLANT
IRRIG SUCT STRYKERFLOW 2 WTIP (MISCELLANEOUS) ×1
IRRIGATION SUCT STRKRFLW 2 WTP (MISCELLANEOUS) ×1 IMPLANT
KIT PINK PAD W/HEAD ARE REST (MISCELLANEOUS) ×1
KIT PINK PAD W/HEAD ARM REST (MISCELLANEOUS) ×1 IMPLANT
KIT TURNOVER CYSTO (KITS) ×1 IMPLANT
LEGGING LITHOTOMY PAIR STRL (DRAPES) ×1 IMPLANT
MANIFOLD NEPTUNE II (INSTRUMENTS) ×1 IMPLANT
OBTURATOR OPTICAL STANDARD 8MM (TROCAR) ×1
OBTURATOR OPTICAL STND 8 DVNC (TROCAR) ×1
OBTURATOR OPTICALSTD 8 DVNC (TROCAR) ×1 IMPLANT
OCCLUDER COLPOPNEUMO (BALLOONS) ×1 IMPLANT
PACK ROBOT WH (CUSTOM PROCEDURE TRAY) ×1 IMPLANT
PACK ROBOTIC GOWN (GOWN DISPOSABLE) ×1 IMPLANT
PAD OB MATERNITY 4.3X12.25 (PERSONAL CARE ITEMS) ×1 IMPLANT
PAD PREP 24X48 CUFFED NSTRL (MISCELLANEOUS) ×1 IMPLANT
PROTECTOR NERVE ULNAR (MISCELLANEOUS) ×1 IMPLANT
RETRACTOR WND ALEXIS 18 MED (MISCELLANEOUS) IMPLANT
RTRCTR WOUND ALEXIS 18CM MED (MISCELLANEOUS)
RTRCTR WOUND ALEXIS 18CM SML (INSTRUMENTS)
SAVER CELL AAL HAEMONETICS (INSTRUMENTS) IMPLANT
SCISSORS LAP 5X45 EPIX DISP (ENDOMECHANICALS) IMPLANT
SEAL CANN UNIV 5-8 DVNC XI (MISCELLANEOUS) ×4 IMPLANT
SEAL XI 5MM-8MM UNIVERSAL (MISCELLANEOUS) ×4
SEALER VESSEL DA VINCI XI (MISCELLANEOUS) ×1
SEALER VESSEL EXT DVNC XI (MISCELLANEOUS) IMPLANT
SET IRRIG Y TYPE TUR BLADDER L (SET/KITS/TRAYS/PACK) IMPLANT
SET TRI-LUMEN FLTR TB AIRSEAL (TUBING) ×1 IMPLANT
SET TUBE FILTERED XL AIRSEAL (SET/KITS/TRAYS/PACK) IMPLANT
SPIKE FLUID TRANSFER (MISCELLANEOUS) ×2 IMPLANT
SUT VIC AB 0 CT1 27 (SUTURE) ×2
SUT VIC AB 0 CT1 27XBRD ANBCTR (SUTURE) ×2 IMPLANT
SUT VICRYL 0 UR6 27IN ABS (SUTURE) IMPLANT
SUT VICRYL RAPIDE 4/0 PS 2 (SUTURE) ×3 IMPLANT
SUT VLOC 180 0 9IN  GS21 (SUTURE) ×1
SUT VLOC 180 0 9IN GS21 (SUTURE) ×1 IMPLANT
TIP RUMI ORANGE 6.7MMX12CM (TIP) IMPLANT
TIP UTERINE 5.1X6CM LAV DISP (MISCELLANEOUS) IMPLANT
TIP UTERINE 6.7X10CM GRN DISP (MISCELLANEOUS) IMPLANT
TIP UTERINE 6.7X6CM WHT DISP (MISCELLANEOUS) IMPLANT
TIP UTERINE 6.7X8CM BLUE DISP (MISCELLANEOUS) IMPLANT
TOWEL OR 17X26 10 PK STRL BLUE (TOWEL DISPOSABLE) ×1 IMPLANT
TROCAR PORT AIRSEAL 8X120 (TROCAR) ×1 IMPLANT
WATER STERILE IRR 1000ML POUR (IV SOLUTION) ×1 IMPLANT

## 2022-03-08 NOTE — Anesthesia Postprocedure Evaluation (Signed)
Anesthesia Post Note  Patient: Melody May  Procedure(s) Performed: XI ROBOTIC ASSISTED LAPAROSCOPIC HYSTERECTOMY AND SALPINGECTOMY (Bilateral: Pelvis)     Patient location during evaluation: PACU Anesthesia Type: General Level of consciousness: awake and alert Pain management: pain level controlled Vital Signs Assessment: post-procedure vital signs reviewed and stable Respiratory status: spontaneous breathing, nonlabored ventilation and respiratory function stable Cardiovascular status: blood pressure returned to baseline Postop Assessment: no apparent nausea or vomiting Anesthetic complications: no   No notable events documented.  Last Vitals:  Vitals:   03/08/22 1135 03/08/22 1146  BP:  138/78  Pulse: 93 86  Resp: 15 11  Temp:  37 C  SpO2: 94% 93%    Last Pain:  Vitals:   03/08/22 1146  TempSrc:   PainSc: [place]

## 2022-03-08 NOTE — Anesthesia Procedure Notes (Signed)
Procedure Name: Intubation Date/Time: 03/08/2022 8:56 AM  Performed by: Genelle Bal, CRNAPre-anesthesia Checklist: Patient identified, Emergency Drugs available, Suction available and Patient being monitored Patient Re-evaluated:Patient Re-evaluated prior to induction Oxygen Delivery Method: Circle system utilized Preoxygenation: Pre-oxygenation with 100% oxygen Induction Type: IV induction Ventilation: Mask ventilation without difficulty and Oral airway inserted - appropriate to patient size Laryngoscope Size: Sabra Heck and 2 Grade View: Grade I Tube type: Oral Tube size: 7.0 mm Number of attempts: 1 Airway Equipment and Method: Stylet and Oral airway Placement Confirmation: ETT inserted through vocal cords under direct vision, positive ETCO2 and breath sounds checked- equal and bilateral Secured at: 21 cm Tube secured with: Tape Dental Injury: Teeth and Oropharynx as per pre-operative assessment

## 2022-03-08 NOTE — Discharge Summary (Signed)
Physician Discharge Summary  Patient ID: Melody May MRN: [id number] DOB/AGE: 1975-05-30 47 y.o.  Admit date: 03/08/2022 Discharge date: 03/08/2022  Admission Diagnoses:  Discharge Diagnoses:  Principal Problem:   Menorrhagia Active Problems:   S/P laparoscopic hysterectomy   Menorrhagia with irregular cycle   Discharged Condition: {condition:18240}  Hospital Course: ***  Consults: {consultation:18241}  Significant Diagnostic Studies: {diagnostics:18242}  Treatments: {Tx:18249}  Discharge Exam: Blood pressure 129/78, pulse 74, temperature 97.6 F (36.4 C), resp. rate 16, height 5' 6"$  (1.676 m), weight 84.9 kg, last menstrual period 02/23/2022, SpO2 97 %. {physical SA:931536  Disposition: Discharge disposition: 01-Home or Self Care       Discharge Instructions     Call MD for:  persistant nausea and vomiting   Complete by: As directed    Call MD for:  redness, tenderness, or signs of infection (pain, swelling, redness, odor or green/yellow discharge around incision site)   Complete by: As directed    Call MD for:  severe uncontrolled pain   Complete by: As directed    Call MD for:  temperature >100.4   Complete by: As directed    Diet general   Complete by: As directed    Driving Restrictions   Complete by: As directed    Avoid driving for 1 week   Increase activity slowly   Complete by: As directed    Lifting restrictions   Complete by: As directed    Avoid lifting over 10 lbs   May shower / Bathe   Complete by: As directed    May walk up steps   Complete by: As directed    No wound care   Complete by: As directed    Sexual Activity Restrictions   Complete by: As directed    Avoid sex for 6 weeks and until approved by Dr. Landry Mellow      Allergies as of 03/08/2022       Reactions   Clindamycin/lincomycin Diarrhea, Nausea And Vomiting   Hydrogenated Palm Oil Glycerides Nausea And Vomiting   Allergic to palm oil in foods. Causes gas and stomach  cramping.   Thimerosal (thiomersal) Hives   Nickel Hives, Rash   Wound Dressing Adhesive Itching, Rash   Any adhesive tapes and bandaids.        Medication List     TAKE these medications    acetaminophen 500 MG tablet Commonly known as: TYLENOL Take 2 tablets (1,000 mg total) by mouth every 8 (eight) hours as needed.   albuterol 108 (90 Base) MCG/ACT inhaler Commonly known as: VENTOLIN HFA Inhale 2 puffs into the lungs every 6 (six) hours as needed for wheezing or shortness of breath. Rescue inhaler   busPIRone 5 MG tablet Commonly known as: BUSPAR Take 5 mg by mouth at bedtime.   cetirizine 10 MG tablet Commonly known as: ZYRTEC Take 10 mg by mouth daily.   CoQ10 200 MG Caps Take by mouth daily. 2 tablets every morning   DHEA PO Take 100 mg by mouth daily.   DULoxetine 60 MG capsule Commonly known as: CYMBALTA Take 60 mg by mouth daily.   fluticasone 50 MCG/ACT nasal spray Commonly known as: FLONASE Place 1 spray into both nostrils daily.   ibuprofen 600 MG tablet Commonly known as: ADVIL Take 1 tablet (600 mg total) by mouth every 6 (six) hours as needed.   magnesium gluconate 500 MG tablet Commonly known as: MAGONATE Take 500 mg by mouth daily.   Melatonin 5 MG Caps Take 5  mg by mouth at bedtime.   montelukast 10 MG tablet Commonly known as: SINGULAIR Take 10 mg by mouth daily.   Nuvigil 250 MG tablet Generic drug: Armodafinil Take 250 mg by mouth daily.   oxyCODONE 5 MG immediate release tablet Commonly known as: Oxy IR/ROXICODONE Take 1-2 tablets (5-10 mg total) by mouth every 6 (six) hours as needed for moderate pain.   Vitamin D-3 125 MCG (5000 UT) Tabs Take 375 mcg by mouth. Takes 3 125 mcg/5000 UT tabs every morning        Follow-up Information     Christophe Louis, MD. Go in 2 week(s).   Specialty: Obstetrics and Gynecology Contact information: [id number] [place] [address] [phone]                  Signed: Christophe Louis 03/08/2022, 4:36 PM

## 2022-03-08 NOTE — H&P (Signed)
Date of Initial H&P: 03/07/2022  History reviewed, patient examined, no change in status, stable for surgery.

## 2022-03-08 NOTE — Op Note (Signed)
03/08/2022  11:18 AM  PATIENT:  Melody May  47 y.o. female  PRE-OPERATIVE DIAGNOSIS:  Menorrhagia  POST-OPERATIVE DIAGNOSIS:  Menorrhagia  PROCEDURE:  Procedure(s): XI ROBOTIC ASSISTED LAPAROSCOPIC HYSTERECTOMY AND SALPINGECTOMY (Bilateral)  SURGEON:  Surgeon(s) and Role:    Christophe Louis, MD - Primary  PHYSICIAN ASSISTANT:   ASSISTANTS: {ASSISTANTS:31801}   ANESTHESIA:   {procedures; anesthesia:812}  EBL:  25 mL   BLOOD ADMINISTERED:{BLOOD GIVEN TYPES AND AMOUNTS:20467}  DRAINS: {Devices; drains:31758}   LOCAL MEDICATIONS USED:  {LOCAL MEDICATIONS:10721995}  SPECIMEN:  {ONC STAGING; AJCC TYPE OF SPECIMEN:115600001}  DISPOSITION OF SPECIMEN:  {SPECIMEN DISPOSITION:204680}  COUNTS:  {OR COUNTS CORRECT/INCORRECT:204690}  TOURNIQUET:  * No tourniquets in log *  DICTATION: .{DICTATION DK:7951610  PLAN OF CARE: {OPTIME PLAN OF PX:2023907  PATIENT DISPOSITION:  {op note disposition:31782}   Delay start of Pharmacological VTE agent (>24hrs) due to surgical blood loss or risk of bleeding: {YES/NO/NOT APPLICABLE:20182}

## 2022-03-08 NOTE — Transfer of Care (Signed)
Immediate Anesthesia Transfer of Care Note  Patient: Lesleyann Ro  Procedure(s) Performed: XI ROBOTIC ASSISTED LAPAROSCOPIC HYSTERECTOMY AND SALPINGECTOMY (Bilateral: Pelvis)  Patient Location: PACU  Anesthesia Type:General  Level of Consciousness: awake, alert , and oriented  Airway & Oxygen Therapy: Patient Spontanous Breathing and Patient connected to face mask oxygen  Post-op Assessment: Report given to RN and Post -op Vital signs reviewed and stable  Post vital signs: Reviewed and stable  Last Vitals:  Vitals Value Taken Time  BP 138/79 03/08/22 1100  Temp    Pulse 88 03/08/22 1103  Resp 13 03/08/22 1103  SpO2 98 % 03/08/22 1103  Vitals shown include unvalidated device data.  Last Pain:  Vitals:   03/08/22 0715  TempSrc: Oral  PainSc: 0-No pain      Patients Stated Pain Goal: 5 ([id number])  Complications: No notable events documented.

## 2022-03-09 ENCOUNTER — Encounter (HOSPITAL_BASED_OUTPATIENT_CLINIC_OR_DEPARTMENT_OTHER): Payer: Self-pay | Admitting: Obstetrics and Gynecology

## 2022-03-09 LAB — SURGICAL PATHOLOGY

## 2022-10-27 DIAGNOSIS — N390 Urinary tract infection, site not specified: Secondary | ICD-10-CM | POA: Diagnosis not present

## 2022-11-25 IMAGING — MG MM DIGITAL SCREENING BILAT W/ TOMO AND CAD
8 series · 9 of 24 positions shown · non-contrast
Comparison: Previous exam(s).

CLINICAL DATA: Screening.

EXAM:
DIGITAL SCREENING BILATERAL MAMMOGRAM WITH TOMOSYNTHESIS AND CAD
TECHNIQUE: Bilateral screening digital craniocaudal and mediolateral oblique
mammograms were obtained. Bilateral screening digital breast
tomosynthesis was performed. The images were evaluated with
computer-aided detection.

[R CC synth-2D]
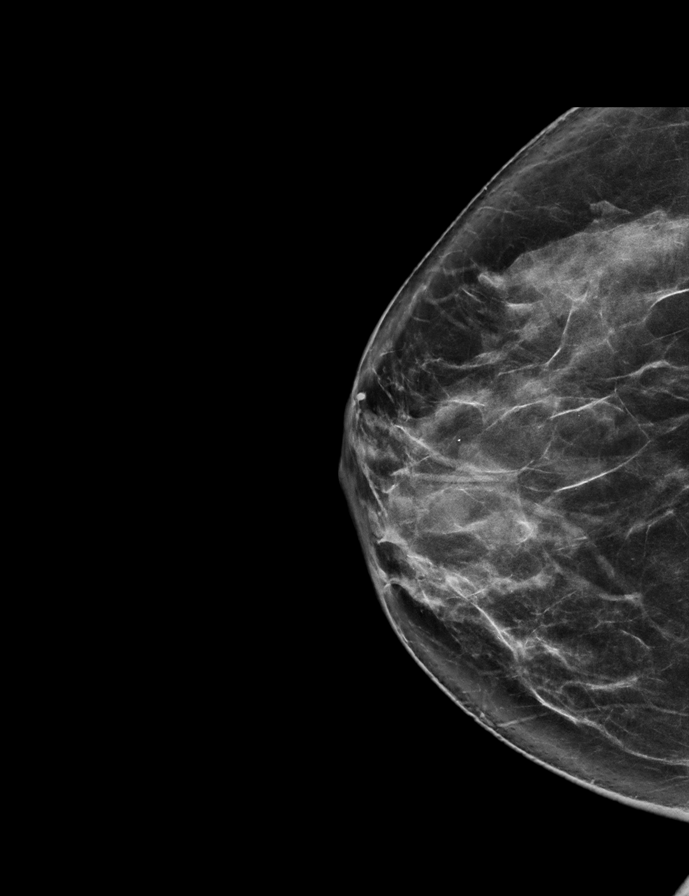

[R MLO synth-2D]
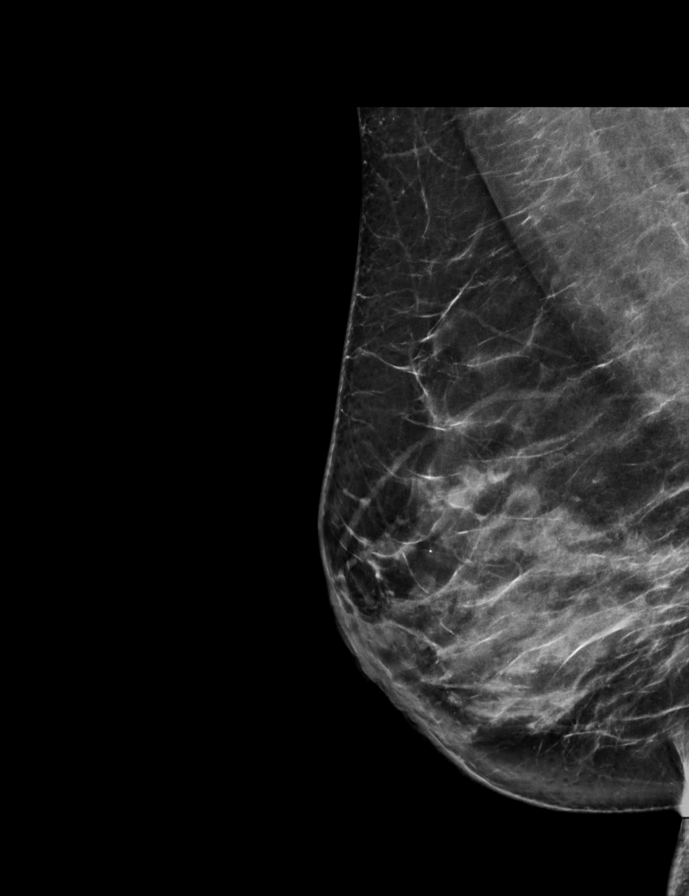

[L MLO synth-2D]
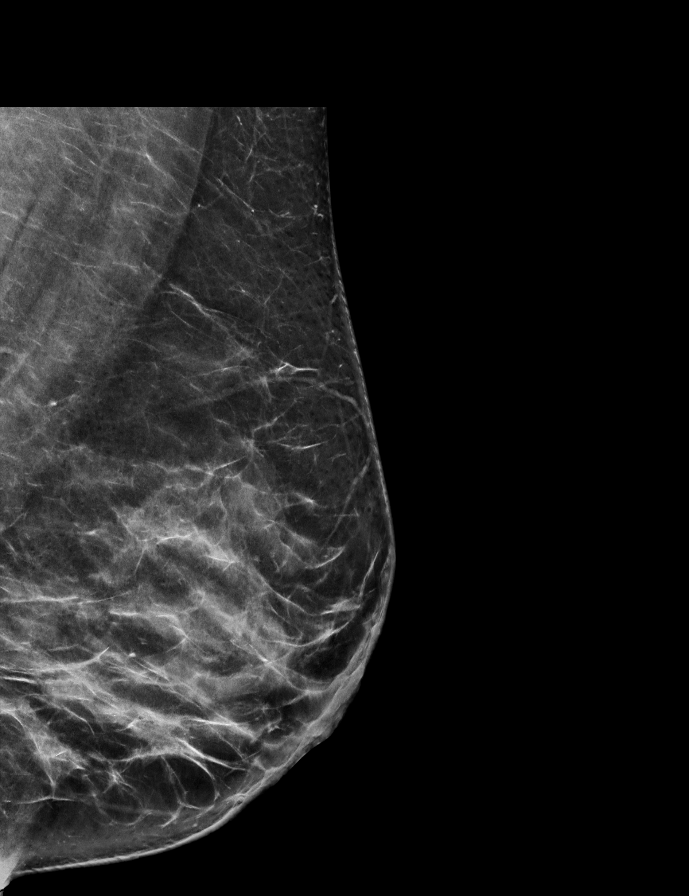

[L CC synth-2D]
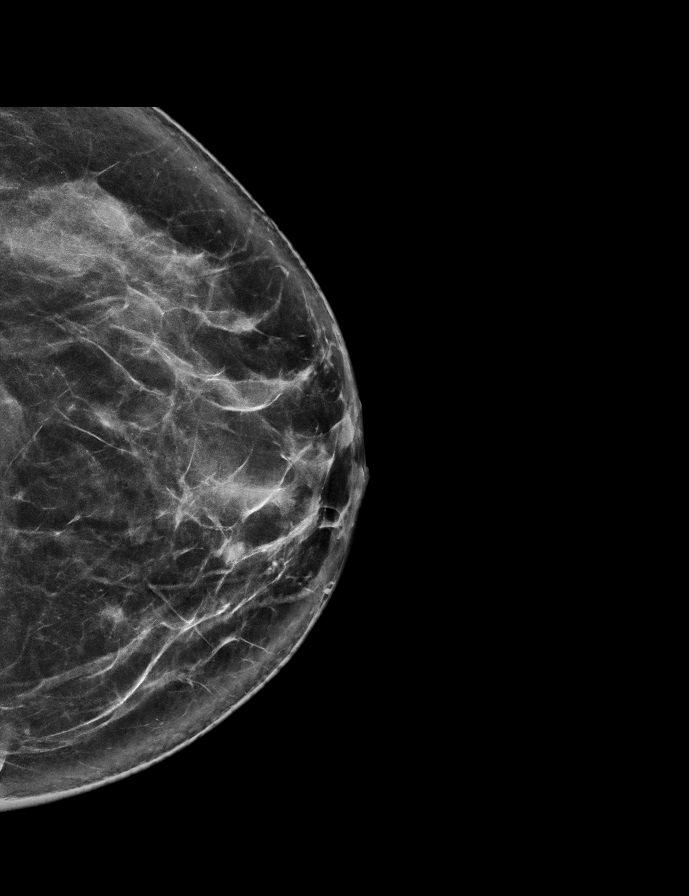

[R CC tomo · 2 of 71 frames shown]
[frame 23/71]
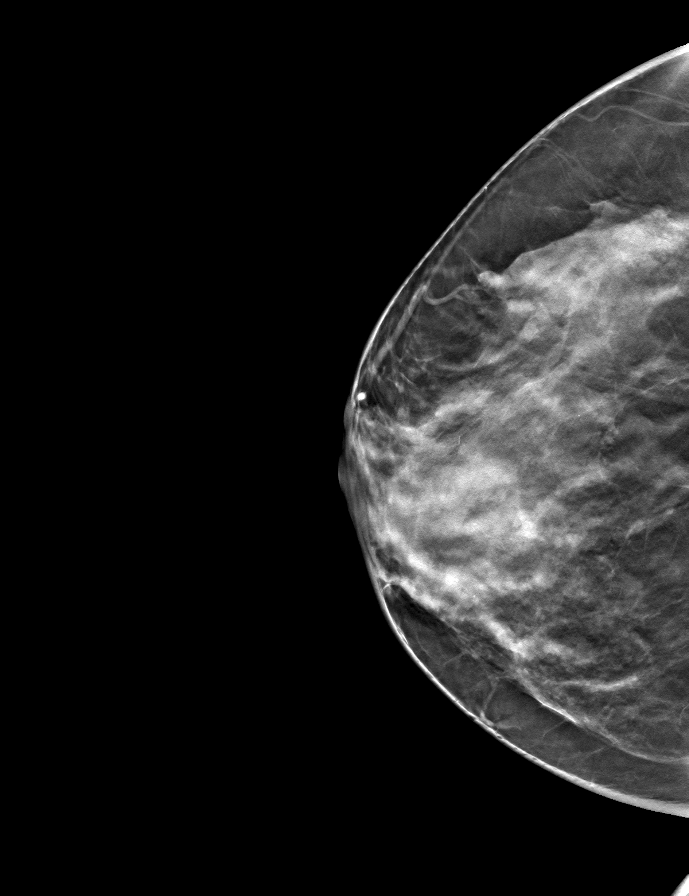
[frame 36/71]
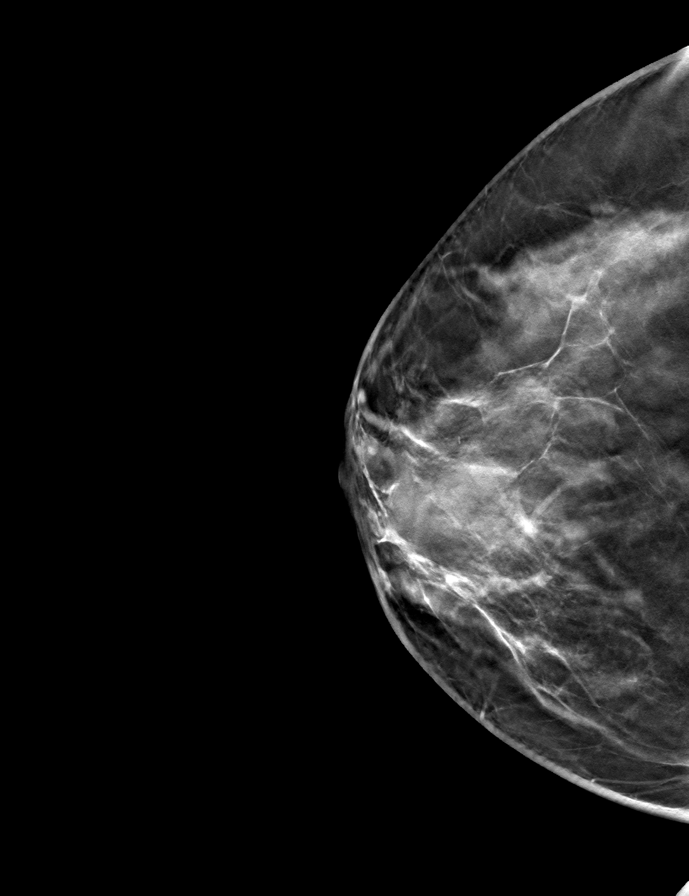

[L CC tomo · tomo slice 39/76.0]
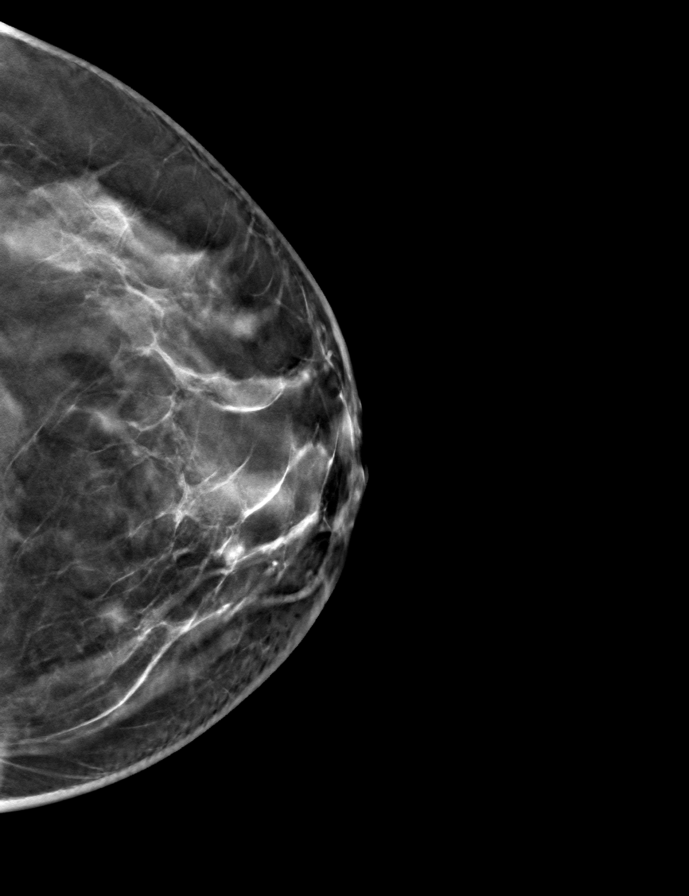

[L MLO tomo · tomo slice 39/76.0]
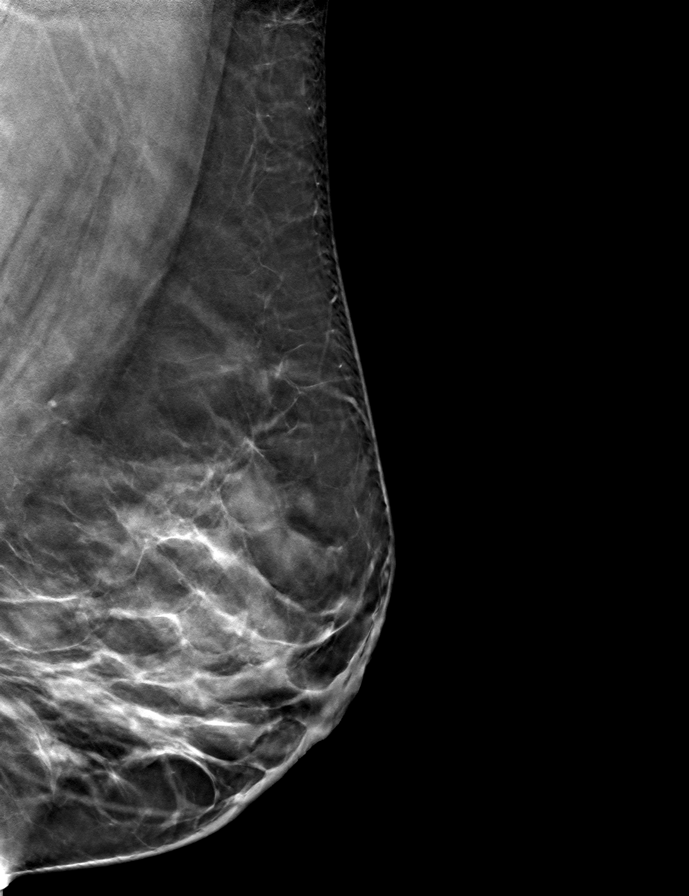

[R MLO tomo · tomo slice 40/79.0]
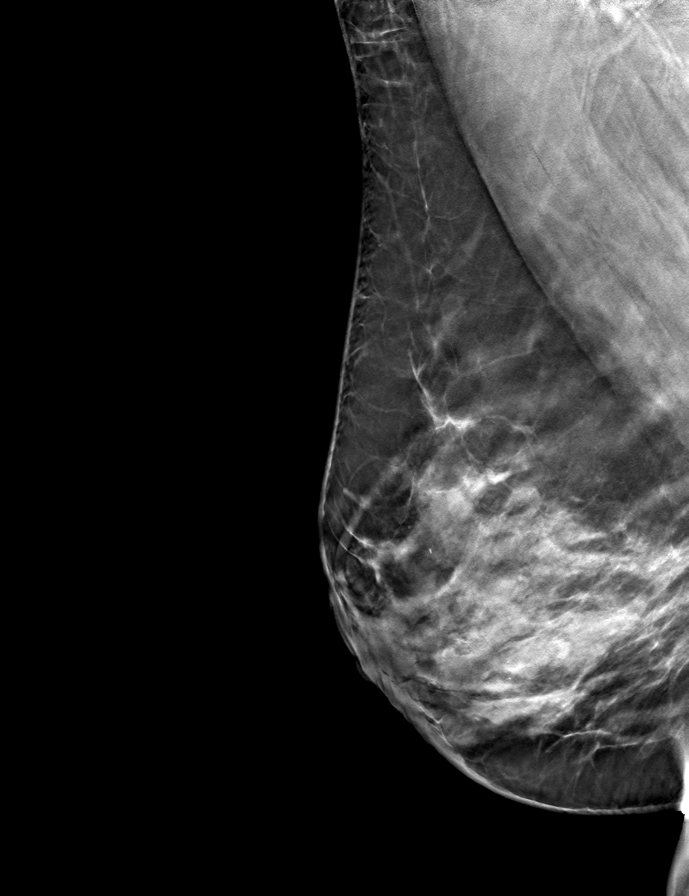

[9 of 24 positions shown; findings below may reference images not displayed]

ACR Breast Density Category c: The breast tissue is heterogeneously
dense, which may obscure small masses.
FINDINGS: There are no findings suspicious for malignancy.
IMPRESSION: No mammographic evidence of malignancy. A result letter of this
screening mammogram will be mailed directly to the patient.

RECOMMENDATION:
Screening mammogram in one year. (Code:[id number]-BC3)

BI-RADS CATEGORY  1: Negative.

## 2022-12-14 DIAGNOSIS — Z01419 Encounter for gynecological examination (general) (routine) without abnormal findings: Secondary | ICD-10-CM | POA: Diagnosis not present

## 2022-12-14 DIAGNOSIS — N390 Urinary tract infection, site not specified: Secondary | ICD-10-CM | POA: Diagnosis not present

## 2022-12-14 DIAGNOSIS — R300 Dysuria: Secondary | ICD-10-CM | POA: Diagnosis not present

## 2023-02-07 DIAGNOSIS — Z1211 Encounter for screening for malignant neoplasm of colon: Secondary | ICD-10-CM | POA: Diagnosis not present

## 2023-02-07 DIAGNOSIS — K644 Residual hemorrhoidal skin tags: Secondary | ICD-10-CM | POA: Diagnosis not present

## 2023-02-07 DIAGNOSIS — K648 Other hemorrhoids: Secondary | ICD-10-CM | POA: Diagnosis not present

## 2023-02-07 DIAGNOSIS — D123 Benign neoplasm of transverse colon: Secondary | ICD-10-CM | POA: Diagnosis not present

## 2023-02-07 DIAGNOSIS — D124 Benign neoplasm of descending colon: Secondary | ICD-10-CM | POA: Diagnosis not present

## 2023-03-16 ENCOUNTER — Other Ambulatory Visit: Payer: Self-pay | Admitting: Family Medicine

## 2023-03-16 DIAGNOSIS — Z1231 Encounter for screening mammogram for malignant neoplasm of breast: Secondary | ICD-10-CM

## 2023-03-17 ENCOUNTER — Ambulatory Visit
Admission: RE | Admit: 2023-03-17 | Discharge: 2023-03-17 | Disposition: A | Discharge status: Home / Self Care | Payer: BC Managed Care – PPO | Source: Ambulatory Visit | Attending: Family Medicine | Admitting: Family Medicine

## 2023-03-17 DIAGNOSIS — Z1231 Encounter for screening mammogram for malignant neoplasm of breast: Secondary | ICD-10-CM | POA: Diagnosis not present

## 2023-03-21 ENCOUNTER — Other Ambulatory Visit: Payer: Self-pay | Admitting: Family Medicine

## 2023-03-21 DIAGNOSIS — R928 Other abnormal and inconclusive findings on diagnostic imaging of breast: Secondary | ICD-10-CM

## 2023-03-29 ENCOUNTER — Ambulatory Visit
Admission: RE | Admit: 2023-03-29 | Discharge: 2023-03-29 | Disposition: A | Discharge status: Home / Self Care | Payer: BC Managed Care – PPO | Source: Ambulatory Visit | Attending: Family Medicine | Admitting: Family Medicine

## 2023-03-29 ENCOUNTER — Other Ambulatory Visit: Payer: Self-pay | Admitting: Family Medicine

## 2023-03-29 DIAGNOSIS — R928 Other abnormal and inconclusive findings on diagnostic imaging of breast: Secondary | ICD-10-CM

## 2023-03-29 DIAGNOSIS — N6489 Other specified disorders of breast: Secondary | ICD-10-CM

## 2023-05-04 DIAGNOSIS — E559 Vitamin D deficiency, unspecified: Secondary | ICD-10-CM | POA: Diagnosis not present

## 2023-05-04 DIAGNOSIS — R7303 Prediabetes: Secondary | ICD-10-CM | POA: Diagnosis not present

## 2023-05-04 DIAGNOSIS — E7800 Pure hypercholesterolemia, unspecified: Secondary | ICD-10-CM | POA: Diagnosis not present

## 2023-05-04 DIAGNOSIS — Z0000 Encounter for general adult medical examination without abnormal findings: Secondary | ICD-10-CM | POA: Diagnosis not present

## 2023-05-04 DIAGNOSIS — Z79899 Other long term (current) drug therapy: Secondary | ICD-10-CM | POA: Diagnosis not present

## 2023-06-20 DIAGNOSIS — J45991 Cough variant asthma: Secondary | ICD-10-CM | POA: Diagnosis not present

## 2023-06-20 DIAGNOSIS — J3089 Other allergic rhinitis: Secondary | ICD-10-CM | POA: Diagnosis not present

## 2023-06-20 DIAGNOSIS — L230 Allergic contact dermatitis due to metals: Secondary | ICD-10-CM | POA: Diagnosis not present

## 2023-06-20 DIAGNOSIS — J301 Allergic rhinitis due to pollen: Secondary | ICD-10-CM | POA: Diagnosis not present

## 2023-09-27 ENCOUNTER — Encounter: Payer: Self-pay | Admitting: Family Medicine

## 2023-10-01 ENCOUNTER — Ambulatory Visit
Admission: RE | Admit: 2023-10-01 | Discharge: 2023-10-01 | Disposition: A | Discharge status: Home / Self Care | Source: Ambulatory Visit | Attending: Family Medicine | Admitting: Family Medicine

## 2023-10-01 DIAGNOSIS — N6489 Other specified disorders of breast: Secondary | ICD-10-CM
